# Patient Record
Sex: Female | Born: 1980 | Race: White | Hispanic: No | Marital: Single | Smoking: Never smoker
Health system: Southern US, Community
[De-identification: ages and names within clinical notes are randomized; demographics above are authoritative.]

## PROBLEM LIST (undated history)

## (undated) ENCOUNTER — Emergency Department (HOSPITAL_COMMUNITY): Admission: EM | Payer: Self-pay | Source: Home / Self Care

## (undated) DIAGNOSIS — F329 Major depressive disorder, single episode, unspecified: Secondary | ICD-10-CM

## (undated) DIAGNOSIS — F319 Bipolar disorder, unspecified: Secondary | ICD-10-CM

## (undated) DIAGNOSIS — E282 Polycystic ovarian syndrome: Secondary | ICD-10-CM

## (undated) DIAGNOSIS — F32A Depression, unspecified: Secondary | ICD-10-CM

---

## 2005-12-24 DIAGNOSIS — E282 Polycystic ovarian syndrome: Secondary | ICD-10-CM

## 2005-12-24 HISTORY — DX: Polycystic ovarian syndrome: E28.2

## 2016-03-15 ENCOUNTER — Encounter (HOSPITAL_COMMUNITY): Payer: Self-pay

## 2016-03-15 ENCOUNTER — Emergency Department (HOSPITAL_COMMUNITY)
Admission: EM | Admit: 2016-03-15 | Discharge: 2016-03-16 | Disposition: A | Discharge status: Home / Self Care | Payer: BLUE CROSS/BLUE SHIELD | Attending: Emergency Medicine | Admitting: Emergency Medicine

## 2016-03-15 DIAGNOSIS — K644 Residual hemorrhoidal skin tags: Secondary | ICD-10-CM | POA: Diagnosis not present

## 2016-03-15 DIAGNOSIS — R197 Diarrhea, unspecified: Secondary | ICD-10-CM | POA: Diagnosis not present

## 2016-03-15 DIAGNOSIS — Z3202 Encounter for pregnancy test, result negative: Secondary | ICD-10-CM | POA: Insufficient documentation

## 2016-03-15 DIAGNOSIS — R1912 Hyperactive bowel sounds: Secondary | ICD-10-CM | POA: Diagnosis not present

## 2016-03-15 DIAGNOSIS — R109 Unspecified abdominal pain: Secondary | ICD-10-CM | POA: Insufficient documentation

## 2016-03-15 DIAGNOSIS — K921 Melena: Secondary | ICD-10-CM | POA: Insufficient documentation

## 2016-03-15 DIAGNOSIS — R112 Nausea with vomiting, unspecified: Secondary | ICD-10-CM | POA: Diagnosis not present

## 2016-03-15 DIAGNOSIS — Z79899 Other long term (current) drug therapy: Secondary | ICD-10-CM | POA: Diagnosis not present

## 2016-03-15 DIAGNOSIS — F319 Bipolar disorder, unspecified: Secondary | ICD-10-CM | POA: Insufficient documentation

## 2016-03-15 HISTORY — DX: Major depressive disorder, single episode, unspecified: F32.9

## 2016-03-15 HISTORY — DX: Bipolar disorder, unspecified: F31.9

## 2016-03-15 HISTORY — DX: Depression, unspecified: F32.A

## 2016-03-15 NOTE — ED Notes (Signed)
Pt states that she started vomiting today about 3pm, and tonight she's had blood in her stools

## 2016-03-16 ENCOUNTER — Emergency Department (HOSPITAL_COMMUNITY): Payer: BLUE CROSS/BLUE SHIELD

## 2016-03-16 ENCOUNTER — Encounter (HOSPITAL_COMMUNITY): Payer: Self-pay | Admitting: Emergency Medicine

## 2016-03-16 LAB — GASTROINTESTINAL PANEL BY PCR, STOOL (REPLACES STOOL CULTURE)
Adenovirus F40/41: NOT DETECTED
Astrovirus: NOT DETECTED
CAMPYLOBACTER SPECIES: NOT DETECTED
Cryptosporidium: NOT DETECTED
Cyclospora cayetanensis: NOT DETECTED
E. COLI O157: NOT DETECTED
ENTEROAGGREGATIVE E COLI (EAEC): NOT DETECTED
Entamoeba histolytica: NOT DETECTED
Enteropathogenic E coli (EPEC): NOT DETECTED
Enterotoxigenic E coli (ETEC): NOT DETECTED
GIARDIA LAMBLIA: NOT DETECTED
NOROVIRUS GI/GII: NOT DETECTED
Plesimonas shigelloides: NOT DETECTED
Rotavirus A: NOT DETECTED
SALMONELLA SPECIES: NOT DETECTED
SAPOVIRUS (I, II, IV, AND V): NOT DETECTED
SHIGELLA/ENTEROINVASIVE E COLI (EIEC): NOT DETECTED
Shiga like toxin producing E coli (STEC): NOT DETECTED
Vibrio cholerae: NOT DETECTED
Vibrio species: NOT DETECTED
Yersinia enterocolitica: NOT DETECTED

## 2016-03-16 LAB — I-STAT BETA HCG BLOOD, ED (MC, WL, AP ONLY)

## 2016-03-16 LAB — CBC WITH DIFFERENTIAL/PLATELET
Basophils Absolute: 0 10*3/uL (ref 0.0–0.1)
Basophils Relative: 0 %
EOS ABS: 0 10*3/uL (ref 0.0–0.7)
Eosinophils Relative: 0 %
HCT: 36.4 % (ref 36.0–46.0)
HEMOGLOBIN: 12.3 g/dL (ref 12.0–15.0)
LYMPHS ABS: 1.9 10*3/uL (ref 0.7–4.0)
Lymphocytes Relative: 21 %
MCH: 29.8 pg (ref 26.0–34.0)
MCHC: 33.8 g/dL (ref 30.0–36.0)
MCV: 88.1 fL (ref 78.0–100.0)
Monocytes Absolute: 0.8 10*3/uL (ref 0.1–1.0)
Monocytes Relative: 9 %
NEUTROS PCT: 70 %
Neutro Abs: 6.2 10*3/uL (ref 1.7–7.7)
Platelets: 241 10*3/uL (ref 150–400)
RBC: 4.13 MIL/uL (ref 3.87–5.11)
RDW: 13.1 % (ref 11.5–15.5)
WBC: 8.9 10*3/uL (ref 4.0–10.5)

## 2016-03-16 LAB — I-STAT CHEM 8, ED
BUN: 11 mg/dL (ref 6–20)
CREATININE: 0.6 mg/dL (ref 0.44–1.00)
Calcium, Ion: 1.15 mmol/L (ref 1.12–1.23)
Chloride: 102 mmol/L (ref 101–111)
GLUCOSE: 87 mg/dL (ref 65–99)
HCT: 39 % (ref 36.0–46.0)
HEMOGLOBIN: 13.3 g/dL (ref 12.0–15.0)
Potassium: 3.5 mmol/L (ref 3.5–5.1)
Sodium: 140 mmol/L (ref 135–145)
TCO2: 25 mmol/L (ref 0–100)

## 2016-03-16 LAB — POC OCCULT BLOOD, ED: Fecal Occult Bld: NEGATIVE

## 2016-03-16 MED ORDER — SODIUM CHLORIDE 0.9 % IV BOLUS (SEPSIS)
1000.0000 mL | Freq: Once | INTRAVENOUS | Status: AC
  Administered 2016-03-16: 1000 mL via INTRAVENOUS

## 2016-03-16 MED ORDER — IOPAMIDOL (ISOVUE-300) INJECTION 61%
100.0000 mL | Freq: Once | INTRAVENOUS | Status: AC | PRN
  Administered 2016-03-16: 100 mL via INTRAVENOUS

## 2016-03-16 MED ORDER — KETOROLAC TROMETHAMINE 30 MG/ML IJ SOLN
30.0000 mg | Freq: Once | INTRAMUSCULAR | Status: AC
  Administered 2016-03-16: 30 mg via INTRAVENOUS
  Filled 2016-03-16: qty 1

## 2016-03-16 MED ORDER — IOHEXOL 300 MG/ML  SOLN
25.0000 mL | Freq: Once | INTRAMUSCULAR | Status: AC | PRN
  Administered 2016-03-16: 25 mL via ORAL

## 2016-03-16 MED ORDER — FENTANYL CITRATE (PF) 100 MCG/2ML IJ SOLN
50.0000 ug | Freq: Once | INTRAMUSCULAR | Status: AC
  Administered 2016-03-16: 50 ug via INTRAVENOUS
  Filled 2016-03-16: qty 2

## 2016-03-16 MED ORDER — ONDANSETRON HCL 4 MG/2ML IJ SOLN
4.0000 mg | Freq: Once | INTRAMUSCULAR | Status: AC
  Administered 2016-03-16: 4 mg via INTRAVENOUS
  Filled 2016-03-16: qty 2

## 2016-03-16 MED ORDER — HYOSCYAMINE SULFATE 0.125 MG SL SUBL: 0.1250 mg | SUBLINGUAL_TABLET | SUBLINGUAL | Status: AC | PRN

## 2016-03-16 MED ORDER — DICYCLOMINE HCL 10 MG/ML IM SOLN
20.0000 mg | Freq: Once | INTRAMUSCULAR | Status: AC
  Administered 2016-03-16: 20 mg via INTRAMUSCULAR
  Filled 2016-03-16: qty 2

## 2016-03-16 MED ORDER — ALIGN PO CAPS: 1.0000 | ORAL_CAPSULE | Freq: Every day | ORAL | Status: AC

## 2016-03-16 NOTE — ED Provider Notes (Signed)
CSN: [id number]     Arrival date & time 03/15/16  2248 History   By signing my name below, I, Arlan Organshley Leger, attest that this documentation has been prepared under the direction and in the presence of Mikal Blasdell, MD.  Electronically Signed: Arlan OrganAshley Leger, ED Scribe. 03/16/2016. 1:36 AM.   Chief Complaint  Patient presents with  . Rectal Bleeding   Patient is a 35 y.o. female presenting with hematochezia. The history is provided by the patient. No language interpreter was used.  Rectal Bleeding Quality:  Bright red Amount:  Moderate Duration:  10 hours Timing:  Intermittent Progression:  Unchanged Chronicity:  New Similar prior episodes: no   Relieved by:  Nothing Worsened by:  Nothing tried Ineffective treatments:  None tried Associated symptoms: abdominal pain and vomiting   Associated symptoms: no fever   Risk factors: no anticoagulant use     HPI Comments: Anson OregonRegina Jaeger is a 35 y.o. female without any pertinent past medical history who presents to the Emergency Department complaining of constant, ongoing rectal bleeding onset 3:00 PM this afternoon. She described blood as streams of blood after passing stools. Pt reports 3 episodes since of onset. She also reports ongoing diarrhea, abdominal pain, nausea, and vomiting. No aggravating or alleviating factors at this time. No OTC medications or home remedies attempted prior to arrival. No recent fever, chills, shortness of breath, or chest pain. No known sick contacts, however, pt currently works in a nursing home.  PCP: No PCP Per Patient    Past Medical History  Diagnosis Date  . Bipolar 1 disorder (HCC)   . Depression    History reviewed. No pertinent past surgical history. History reviewed. No pertinent family history. Social History  Substance Use Topics  . Smoking status: Never Smoker   . Smokeless tobacco: None  . Alcohol Use: No   OB History    No data available     Review of Systems  Constitutional:  Negative for fever and chills.  Respiratory: Negative for cough and shortness of breath.   Gastrointestinal: Positive for vomiting, abdominal pain, blood in stool and hematochezia.  Musculoskeletal: Negative for back pain.  Psychiatric/Behavioral: Negative for confusion.  All other systems reviewed and are negative.     Allergies  Review of patient's allergies indicates no known allergies.  Home Medications   Prior to Admission medications   Medication Sig Start Date End Date Taking? Authorizing Provider  AVIANE 0.1-20 MG-MCG tablet Take 1 tablet by mouth daily. 03/05/16  Yes Historical Provider, MD  lamoTRIgine (LAMICTAL) 100 MG tablet Take 1 tablet by mouth daily. 02/17/16  Yes Historical Provider, MD  montelukast (SINGULAIR) 10 MG tablet Take 1 tablet by mouth daily. 03/05/16  Yes Historical Provider, MD  QUEtiapine (SEROQUEL) 300 MG tablet Take 1 tablet by mouth daily. 02/24/16  Yes Historical Provider, MD   Triage Vitals: BP 131/56 mmHg  Pulse 78  Temp(Src) 97.9 F (36.6 C) (Oral)  Resp 20  Ht 5\' 1"  (1.549 m)  Wt 234 lb (106.142 kg)  BMI 44.24 kg/m2  SpO2 98%  LMP 03/08/2016   Physical Exam  Constitutional: She is oriented to person, place, and time. She appears well-developed and well-nourished. No distress.  HENT:  Head: Normocephalic and atraumatic.  Mouth/Throat: Oropharynx is clear and moist. No oropharyngeal exudate.  Eyes: EOM are normal. Pupils are equal, round, and reactive to light.  Neck: Normal range of motion. Neck supple.  Cardiovascular: Normal rate, regular rhythm and normal heart sounds.  Pulmonary/Chest: Effort normal and breath sounds normal. She has no wheezes. She has no rales.  Abdominal: Soft. She exhibits no distension. There is no tenderness. There is no rebound and no guarding.  Hyperactive bowel sounds throughout Gassy throughout distribution of the colon   Genitourinary: Guaiac negative stool.  Chaperone present during rectal exam  Internal  anal fissure noted First degree external hemorrhoid noted   Musculoskeletal: Normal range of motion.  Neurological: She is alert and oriented to person, place, and time.  Skin: Skin is warm and dry.  Psychiatric: She has a normal mood and affect. Judgment normal.  Nursing note and vitals reviewed.   ED Course  Procedures (including critical care time)  DIAGNOSTIC STUDIES: Oxygen Saturation is 98% on RA, Normal by my interpretation.    COORDINATION OF CARE: 1:36 AM-Discussed treatment plan with pt at bedside and pt agreed to plan.     Labs Review Labs Reviewed  GASTROINTESTINAL PANEL BY PCR, STOOL (REPLACES STOOL CULTURE)  CBC WITH DIFFERENTIAL/PLATELET  I-STAT CHEM 8, ED  I-STAT BETA HCG BLOOD, ED ([facility], [facility], [facility] ONLY)  POC OCCULT BLOOD, ED    Imaging Review No results found. I have personally reviewed and evaluated these images and lab results as part of my medical decision-making.   EKG Interpretation None      MDM   Final diagnoses:  None   Patient had diarrhea and then took a laxative when she already had diarrhea.  I suspect this is the source of the pain in the abdomen.  Have you PMD follow up on stool studies.  No more laxatives.    Suspect fissuring caused bleeding.  Follow up at your PMD office to give stool sample as unable to produce one in the ED despite the fact that patient reports diarrhea.  PO challenged successfully, hemoccult is negative.  Stable for discharge with close follow up.  GI as directed, strict return precautions given   Will start levsin and probiotics and give bland diet instructions     I personally performed the services described in this documentation, which was scribed in my presence. The recorded information has been reviewed and is accurate.     Cy Blamer, MD 03/16/16 [phone]

## 2016-10-12 ENCOUNTER — Encounter (HOSPITAL_BASED_OUTPATIENT_CLINIC_OR_DEPARTMENT_OTHER): Payer: Self-pay | Admitting: Emergency Medicine

## 2016-10-12 ENCOUNTER — Emergency Department (HOSPITAL_BASED_OUTPATIENT_CLINIC_OR_DEPARTMENT_OTHER): Payer: BLUE CROSS/BLUE SHIELD

## 2016-10-12 ENCOUNTER — Emergency Department (HOSPITAL_BASED_OUTPATIENT_CLINIC_OR_DEPARTMENT_OTHER)
Admission: EM | Admit: 2016-10-12 | Discharge: 2016-10-12 | Disposition: A | Discharge status: Home / Self Care | Payer: BLUE CROSS/BLUE SHIELD | Attending: Emergency Medicine | Admitting: Emergency Medicine

## 2016-10-12 DIAGNOSIS — Z79899 Other long term (current) drug therapy: Secondary | ICD-10-CM | POA: Diagnosis not present

## 2016-10-12 DIAGNOSIS — M79604 Pain in right leg: Secondary | ICD-10-CM | POA: Insufficient documentation

## 2016-10-12 HISTORY — DX: Polycystic ovarian syndrome: E28.2

## 2016-10-12 NOTE — ED Notes (Signed)
Patient awaiting US read.  Patient given graham crackers and peanut butter

## 2016-10-12 NOTE — ED Provider Notes (Signed)
[facility]   CSN: [id number] Arrival date & time: 10/12/16  1524  By signing my name below, I, Modena Jansky, attest that this documentation has been prepared under the direction and in the presence of non-physician practitioner, Rise Mu, PA-C. Electronically Signed: Modena Jansky, Scribe. 10/12/2016. 4:52 PM.  History   Chief Complaint Chief Complaint  Patient presents with  . Leg Pain   The history is provided by the patient. No language interpreter was used.   HPI Comments: Leslie Charles is a 35 y.o. female who presents to the Emergency Department complaining of constant moderate RLE pain that started about 3 weeks ago. She reports that she had a sudden onset of pain when waking up, which she suspects is from an insect bite or electrocution. She was sent here today from [facility] for an ultrasound to rule out a potential DVT. She reports associated symptoms of pain, swelling (onset 2 days ago), and bruising to the RLE. She describes the pain as exacerbated by movement. She has been on an oral contraceptive for the past 15 years. She denies any hx of DVT, recent hospitalizations, prolonged immbolization, new back pain, bowel/bladder incontinence, SOB, chest pain, nausea, vomiting, or other complaints.   Past Medical History:  Diagnosis Date  . Bipolar 1 disorder (HCC)   . Depression   . PCOS (polycystic ovarian syndrome) 2007    There are no active problems to display for this patient.   History reviewed. No pertinent surgical history.  OB History    No data available       Home Medications    Prior to Admission medications   Medication Sig Start Date End Date Taking? Authorizing Provider  AVIANE 0.1-20 MG-MCG tablet Take 1 tablet by mouth daily. 03/05/16  Yes Historical Provider, MD  lamoTRIgine (LAMICTAL) 100 MG tablet Take 1 tablet by mouth daily. 02/17/16  Yes Historical Provider, MD  montelukast (SINGULAIR) 10 MG tablet Take 1  tablet by mouth daily. 03/05/16  Yes Historical Provider, MD  QUEtiapine (SEROQUEL) 300 MG tablet Take 1 tablet by mouth daily. 02/24/16  Yes Historical Provider, MD  bifidobacterium infantis (ALIGN) capsule Take 1 capsule by mouth daily. 03/16/16   April Palumbo, MD  hyoscyamine (LEVSIN/SL) 0.125 MG SL tablet Place 1 tablet (0.125 mg total) under the tongue every 4 (four) hours as needed. 03/16/16   April Palumbo, MD    Family History No family history on file.  Social History Social History  Substance Use Topics  . Smoking status: Never Smoker  . Smokeless tobacco: Never Used  . Alcohol use No     Allergies   Review of patient's allergies indicates no known allergies.   Review of Systems Review of Systems  Constitutional: Negative for chills and fever.  Respiratory: Negative for shortness of breath.   Cardiovascular: Positive for leg swelling (RLE). Negative for chest pain.  Gastrointestinal: Negative for nausea and vomiting.  Musculoskeletal: Positive for myalgias (RLE). Negative for back pain.  Skin: Positive for color change.  Neurological: Negative for dizziness, syncope, weakness, light-headedness, numbness and headaches.  All other systems reviewed and are negative.    Physical Exam Updated Vital Signs BP (!) 122/49 (BP Location: Left Arm)   Pulse 72   Temp 98.1 F (36.7 C) (Oral)   Resp 18   Ht 5\' 1"  (1.549 m)   Wt 253 lb 8.5 oz (115 kg)   SpO2 100%   BMI 47.90 kg/m   Physical Exam  Constitutional:  She appears well-developed and well-nourished. No distress.  HENT:  Head: Normocephalic.  Eyes: Conjunctivae are normal.  Neck: Neck supple.  Cardiovascular: Normal rate, regular rhythm, normal heart sounds and intact distal pulses.  Exam reveals no gallop and no friction rub.   No murmur heard. Pulmonary/Chest: Effort normal and breath sounds normal. No respiratory distress.  Abdominal: Soft.  Musculoskeletal: Normal range of motion.  TTP of the popliteal  fossa of the right knee and along the medial upper leg. There is small amount of edema and ecchymosis noted to the popliteal fossa and medial inner thigh. No calf tenderness. No erythema, warmth, induration, or area of fluctuance appreciated. Patient with full ROM of the right knee. Sensation intact. DP pulses are 2+ bilaterally. Cap refill normal.   Neurological: She is alert.  Skin: Skin is warm and dry. Capillary refill takes less than 2 seconds.  Psychiatric: She has a normal mood and affect.  Nursing note and vitals reviewed.    ED Treatments / Results  DIAGNOSTIC STUDIES: Oxygen Saturation is 100% on RA, normal by my interpretation.    COORDINATION OF CARE: 4:56 PM- Pt advised of plan for treatment and pt agrees.  Labs (all labs ordered are listed, but only abnormal results are displayed) Labs Reviewed - No data to display  EKG  EKG Interpretation None       Radiology US Venous Img Lower Unilateral Right  Result Date: 10/12/2016 CLINICAL DATA:  Right inner thigh swelling and pain for 2 days with patchy red spots on scan. Tender to touch. EXAM: Right LOWER EXTREMITY VENOUS DOPPLER ULTRASOUND TECHNIQUE: Gray-scale sonography with graded compression, as well as color Doppler and duplex ultrasound were performed to evaluate the lower extremity deep venous systems from the level of the common femoral vein and including the common femoral, femoral, profunda femoral, popliteal and calf veins including the posterior tibial, peroneal and gastrocnemius veins when visible. The superficial great saphenous vein was also interrogated. Spectral Doppler was utilized to evaluate flow at rest and with distal augmentation maneuvers in the common femoral, femoral and popliteal veins. COMPARISON:  None. FINDINGS: Contralateral Common Femoral Vein: Respiratory phasicity is normal and symmetric with the symptomatic side. No evidence of thrombus. Normal compressibility. Common Femoral Vein: No evidence  of thrombus. Normal compressibility, respiratory phasicity and response to augmentation. Saphenofemoral Junction: No evidence of thrombus. Normal compressibility and flow on color Doppler imaging. Profunda Femoral Vein: No evidence of thrombus. Normal compressibility and flow on color Doppler imaging. Femoral Vein: No evidence of thrombus. Normal compressibility, respiratory phasicity and response to augmentation. Popliteal Vein: No evidence of thrombus. Normal compressibility, respiratory phasicity and response to augmentation. Calf Veins: No evidence of thrombus. Normal compressibility and flow on color Doppler imaging. Perineal veins not seen. Superficial Great Saphenous Vein: No evidence of thrombus. Normal compressibility and flow on color Doppler imaging. Venous Reflux:  None. Other Findings:  None. IMPRESSION: No evidence of venous thrombosis within the right lower extremity. Electronically Signed   By: Elberta Fortis M.D.   On: 10/12/2016 19:50    Procedures Procedures (including critical care time)  Medications Ordered in ED Medications - No data to display   Initial Impression / Assessment and Plan / ED Course  I have reviewed the triage vital signs and the nursing notes.  Pertinent labs & imaging results that were available during my care of the patient were reviewed by me and considered in my medical decision making (see chart for details).  Clinical Course  Patient  presents to the ED for right leg pain and r/o DVT. Edema and ecchymosis noted to the right leg. US of lower extremity without evidence of DVT. Patient is without cp or sob. No sign of cellulitis or septic joint is appreciated. She is afebrile and not tachycardic. Patient given ace wrap and encouraged symptomatic treatment at home. If symptoms persists or fail to improve encouraged to return to the follow up with her pcp. Strict return precautions given. Hemodynamically stable and non toxic appearing. Patient verbalized  understanding with plan of care. Discharge home in NAD with stable vs. Patient discussed with Dr. Ranae PalmsYelverton who is agreeable to plan.   Final Clinical Impressions(s) / ED Diagnoses   Final diagnoses:  Right leg pain    New Prescriptions Discharge Medication List as of 10/12/2016  8:16 PM    I personally performed the services described in this documentation, which was scribed in my presence. The recorded information has been reviewed and is accurate.     Rise MuKenneth T Brynnan Rodenbaugh, PA-C 10/14/16 2142    Loren Raceravid Yelverton, MD 10/19/16 [phone]

## 2016-10-12 NOTE — ED Triage Notes (Signed)
Right leg pain with redness and bruising. Sent here from [facility] for US to rule out DVT. Denies Hx of blood clots.

## 2016-10-12 NOTE — ED Notes (Signed)
Patient is alert and oriented x4.  She is complaining of right Lower leg pain, swelling and redness in her calf.

## 2016-10-12 NOTE — Discharge Instructions (Signed)
Please wear the ace wrap. Ice and elevate your right leg. Stay off of your leg this weekend. If your symptoms worsen return to the ED. If you develop fevers please return to the ED. Follow up with a primary care doctor I have given you a referral.

## 2017-11-19 IMAGING — CT CT ABD-PELV W/ CM
2 of 4 series · 16 of 46 positions shown, 18 images · IV contrast (100 ML OMNI 300)
Comparison: None.

CLINICAL DATA: 34-year-old female with abdominal pain and diarrhea

EXAM:
CT ABDOMEN AND PELVIS WITH CONTRAST
TECHNIQUE: Multidetector CT imaging of the abdomen and pelvis was performed
using the standard protocol following bolus administration of
intravenous contrast.
CONTRAST:  100mL X9DGEN-QNN IOPAMIDOL (X9DGEN-QNN) INJECTION 61%

[Series 2: abd/pel with · axial · 0.74mm/px · z∈[+1050,+1466]mm · 13 of 91 slices shown, 15 images]
[im 4/91  soft-tissue]
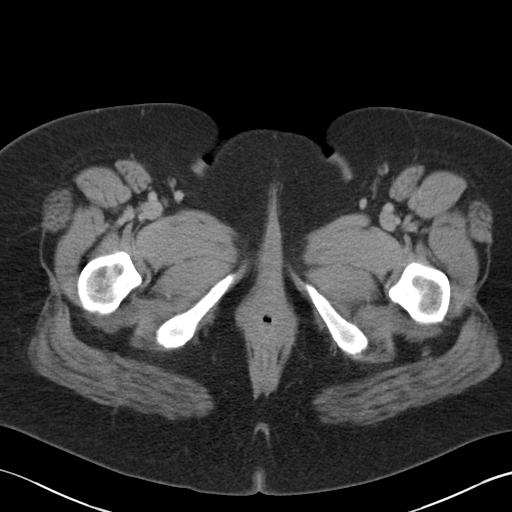
[im 4/91  bone]
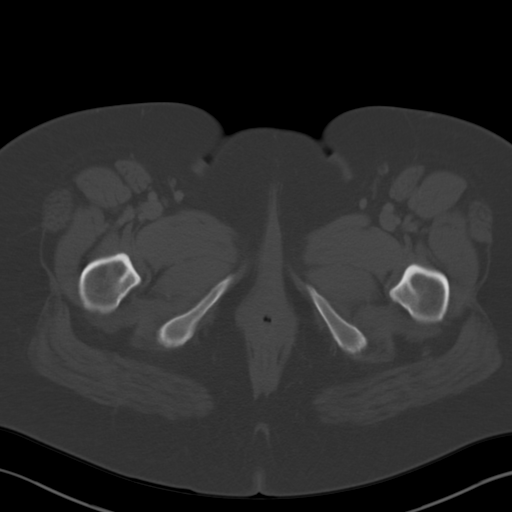
[im 11/91  soft-tissue]
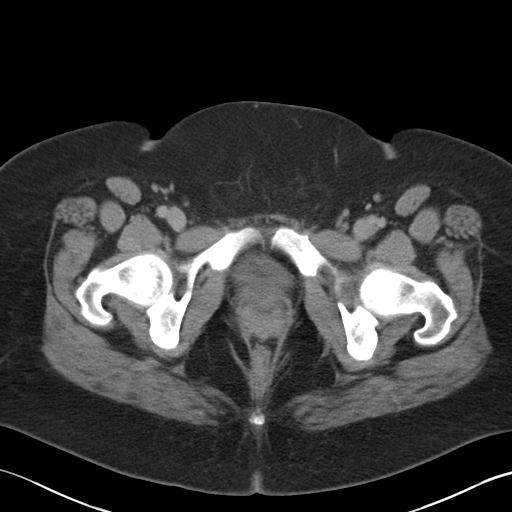
[im 19/91  soft-tissue]
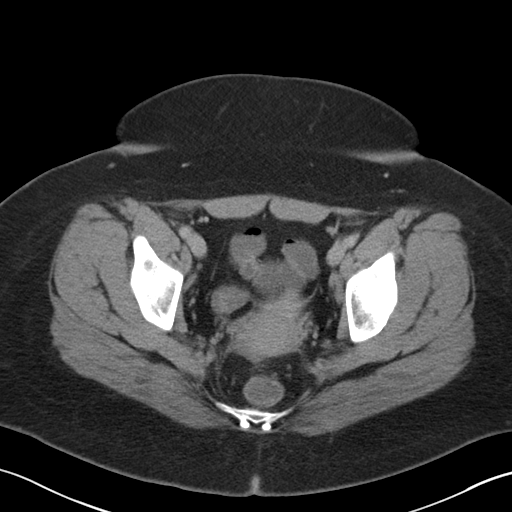
[im 26/91  soft-tissue]
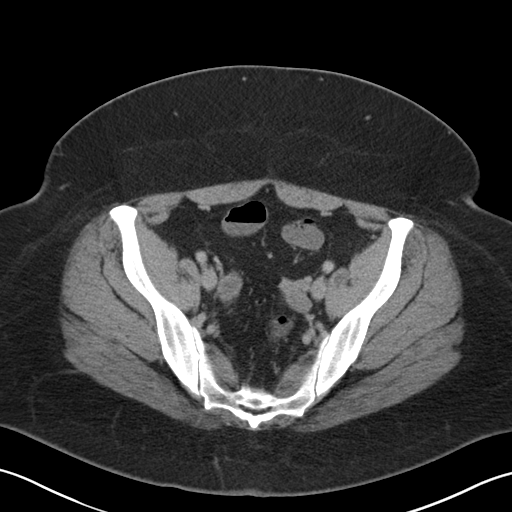
[im 33/91  soft-tissue]
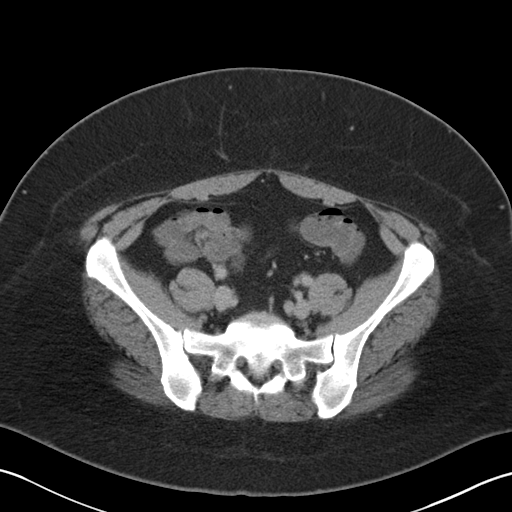
[im 40/91  soft-tissue]
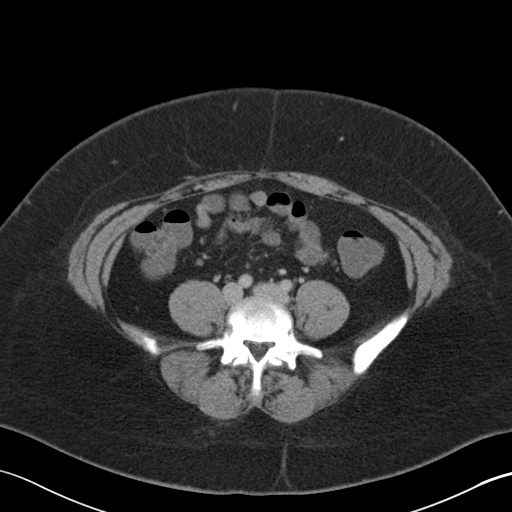
[im 47/91  soft-tissue]
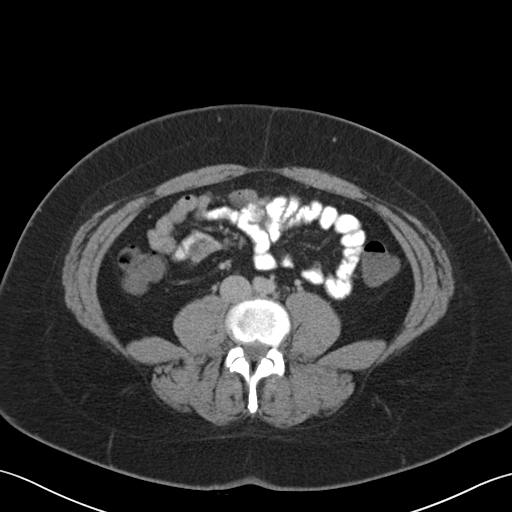
[im 51/91  soft-tissue]
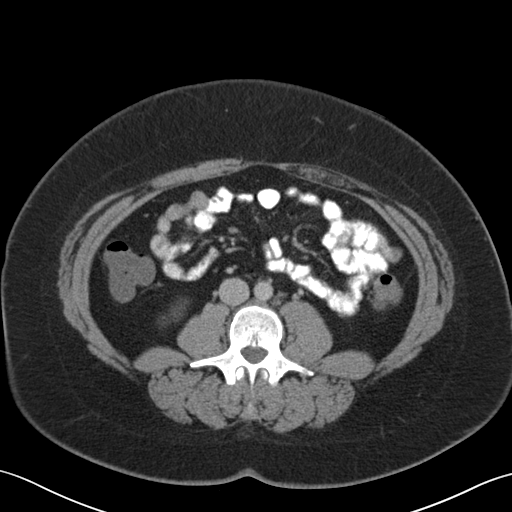
[im 58/91  soft-tissue]
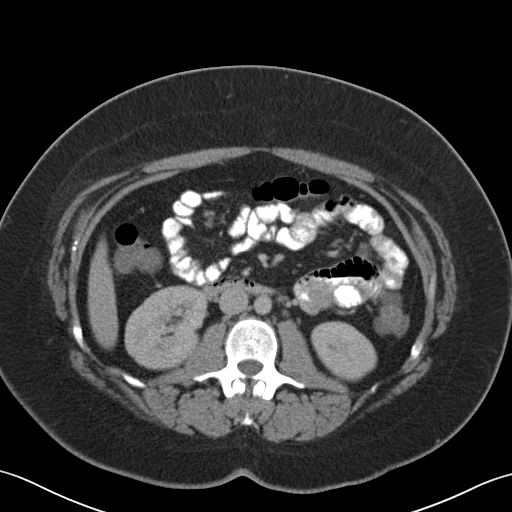
[im 58/91  bone]
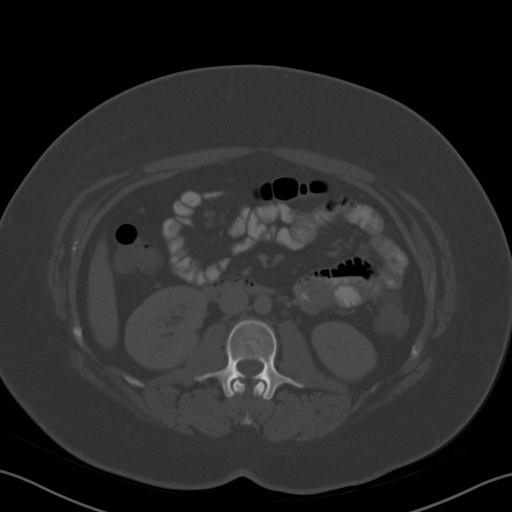
[im 65/91  soft-tissue]
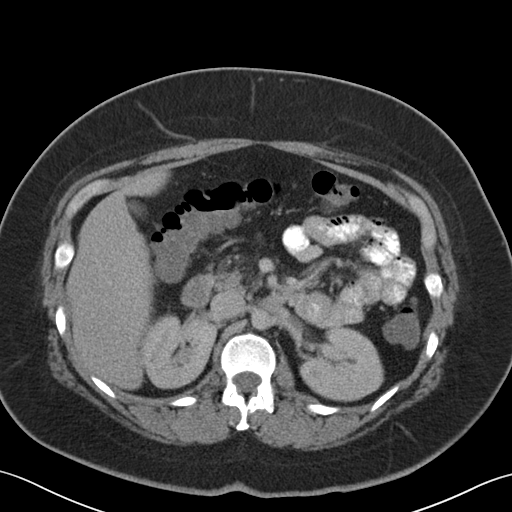
[im 73/91  soft-tissue]
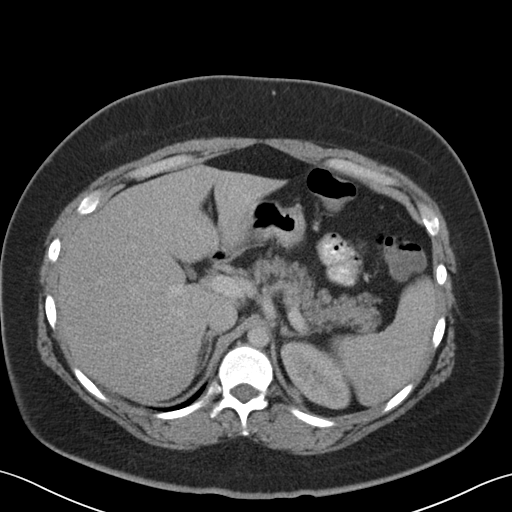
[im 80/91  soft-tissue]
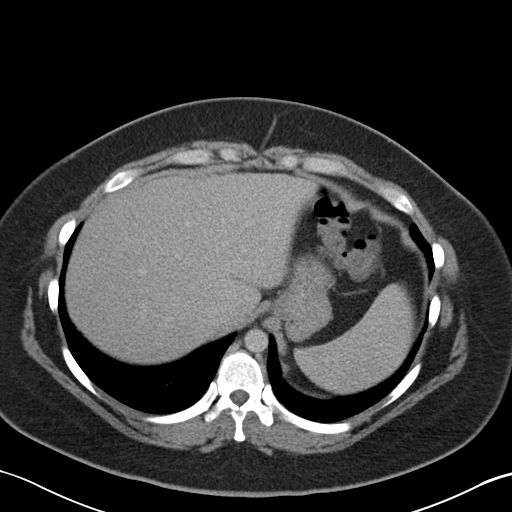
[im 87/91  soft-tissue]
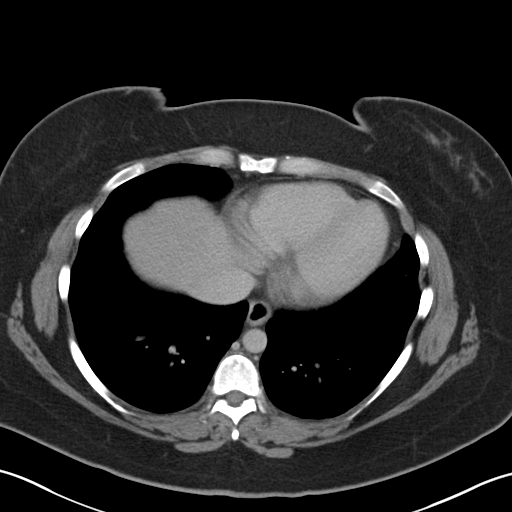

[Series 4: coronal a/|p · coronal · 0.74mm/px · 3 of 96 slices shown]
[im 32/96  soft-tissue]
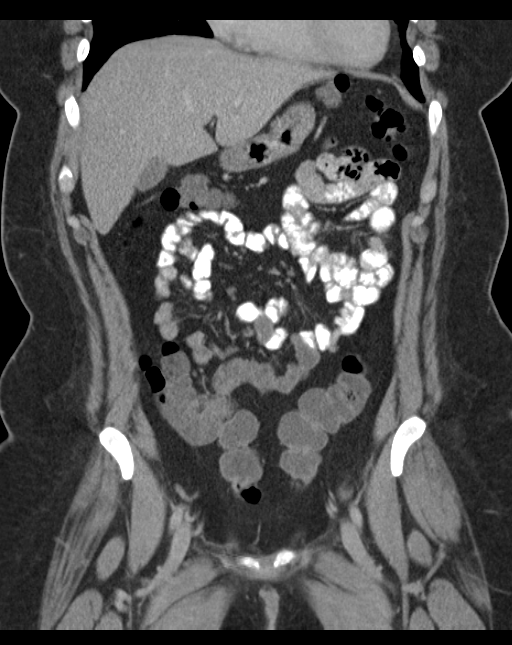
[im 43/96  soft-tissue]
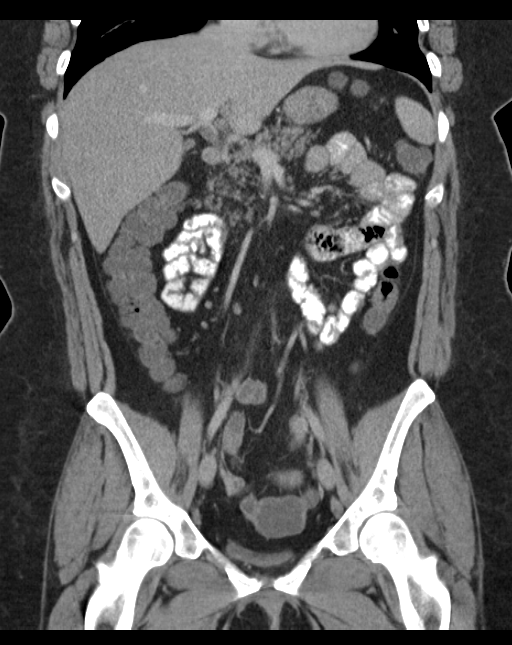
[im 53/96  soft-tissue]
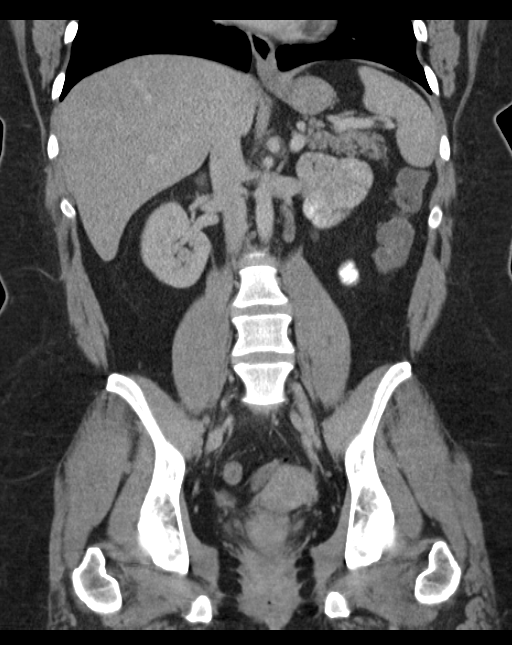

[16 of 46 positions shown; findings below may reference images not displayed]

FINDINGS: The visualized lung bases are clear. No intra-abdominal free air or
free fluid.

The liver, gallbladder, pancreas, spleen, adrenal glands, kidneys,
visualized ureters, and urinary bladder appear unremarkable. There
is slight prominence of the lower uterus/upper vagina. Ultrasound
may provide better evaluation of the pelvic structures.

Liquid stool noted throughout the colon compatible with diarrheal
state. Correlation with clinical exam and stool cultures
recommended. There is no evidence of bowel obstruction or
inflammation. Normal appendix.

The abdominal aorta and IVC appear patent. No portal venous gas
identified. There is no adenopathy. The abdominal wall soft tissues
appear unremarkable. The osseous structures are intact.
IMPRESSION: Diarrheal state. Correlation with clinical exam and stool cultures
recommended. No evidence of bowel obstruction or inflammation.
Normal appendix.
